# Patient Record
Sex: Female | Born: 1960 | Race: White | Hispanic: No | Marital: Married | Smoking: Never smoker
Health system: Southern US, Community
[De-identification: ages and names within clinical notes are randomized; demographics above are authoritative.]

## PROBLEM LIST (undated history)

## (undated) HISTORY — PX: BACK SURGERY: SHX140

---

## 2005-03-07 ENCOUNTER — Ambulatory Visit: Payer: Self-pay

## 2009-12-29 ENCOUNTER — Ambulatory Visit: Payer: Self-pay

## 2010-01-09 ENCOUNTER — Ambulatory Visit: Payer: Self-pay

## 2010-06-28 ENCOUNTER — Ambulatory Visit: Payer: Self-pay

## 2011-01-03 ENCOUNTER — Ambulatory Visit: Payer: Self-pay

## 2011-01-04 IMAGING — US ULTRASOUND RIGHT BREAST
1 series · 17 of 25 positions shown · non-contrast
Comparison: none

REASON FOR EXAM: DENSITY
COMMENTS:

[Series 1: ultrasound right breast · 17 of 28 slices shown]
[im 1/28]
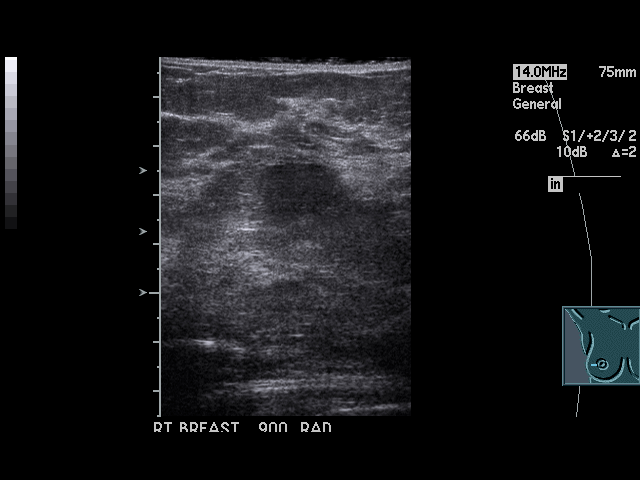
[im 3/28]
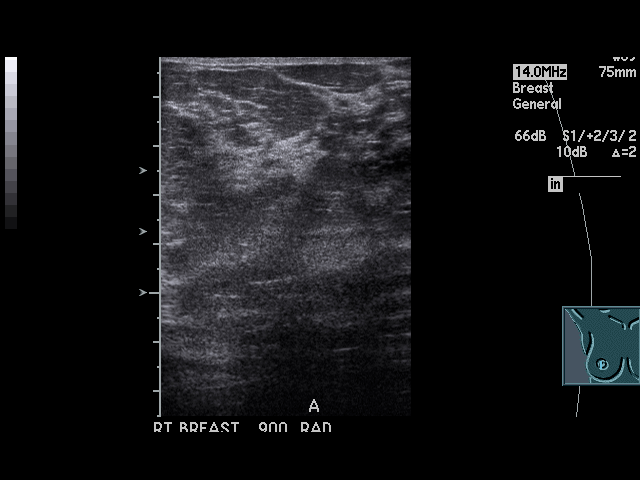
[im 4/28]
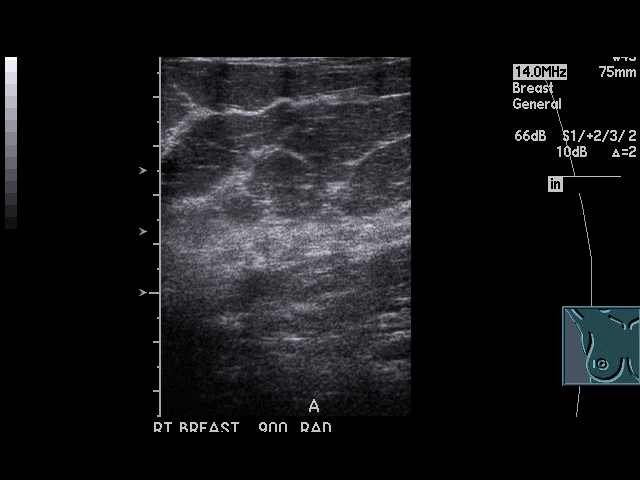
[im 6/28]
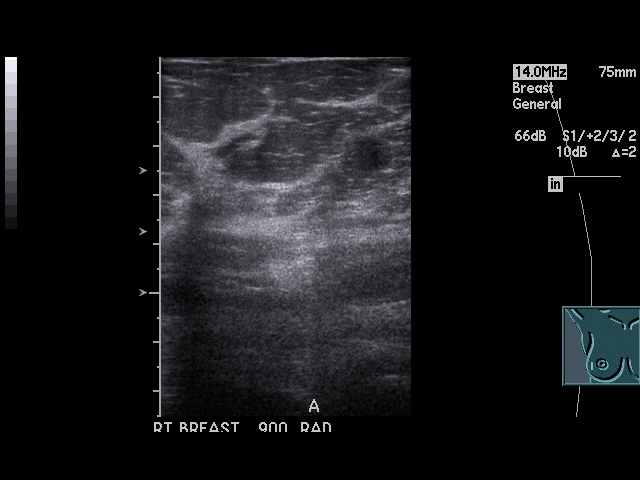
[im 7/28]
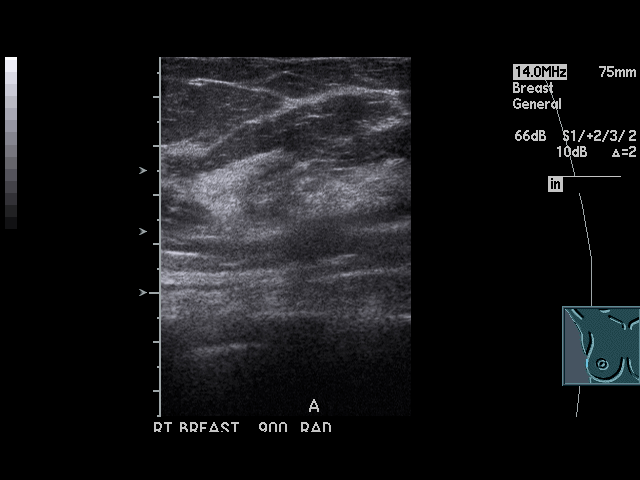
[im 10/28]
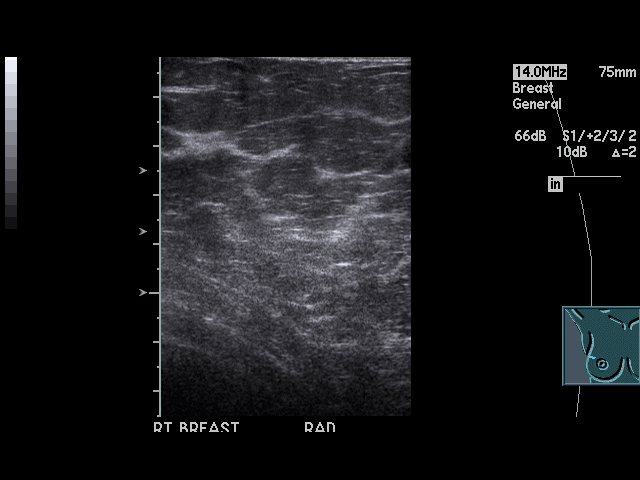
[im 11/28]
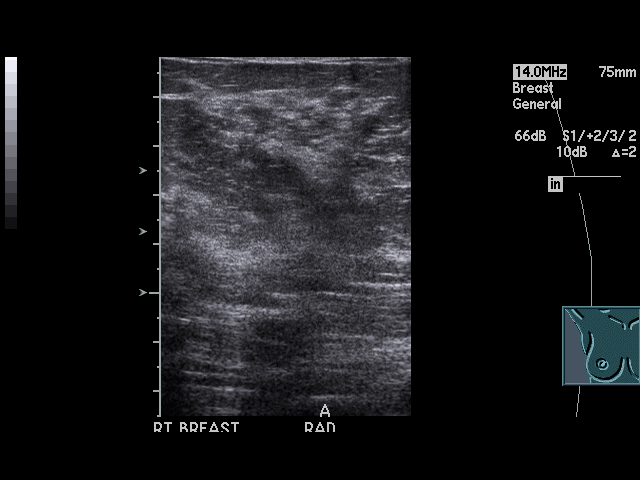
[im 13/28]
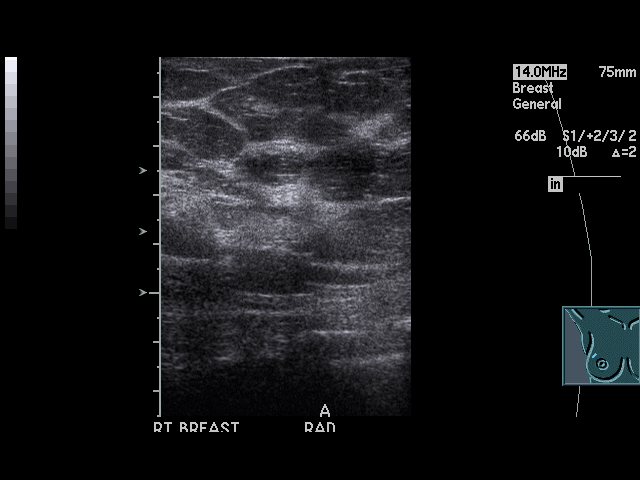
[im 14/28]
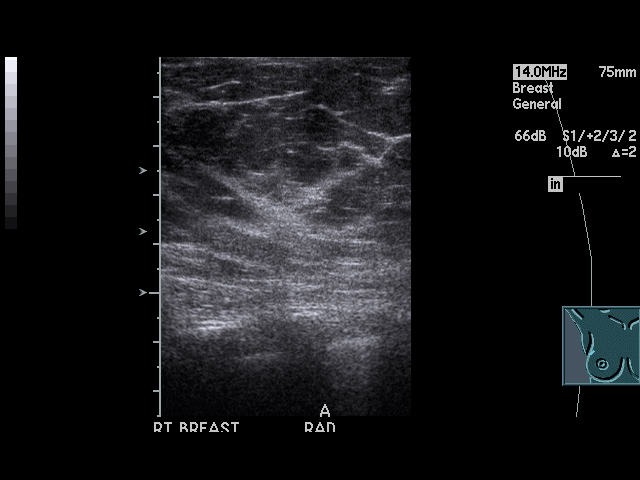
[im 15/28]
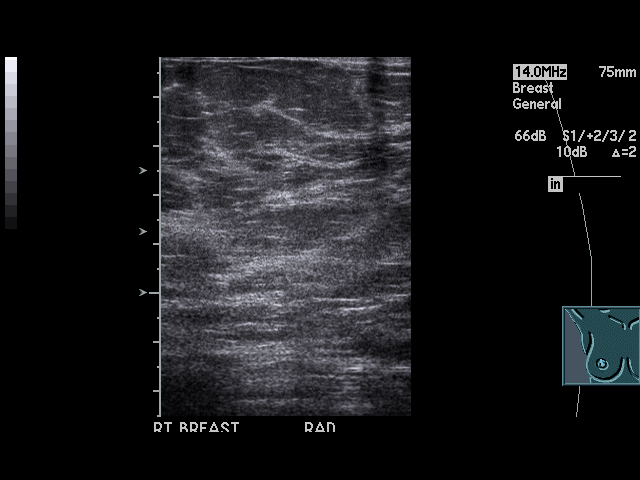
[im 17/28]
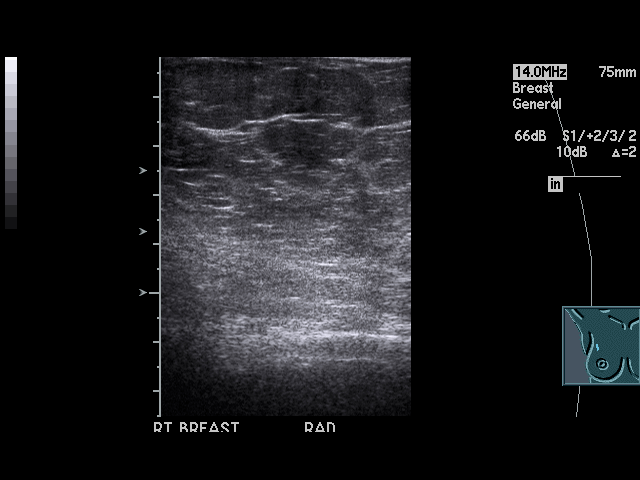
[im 19/28]
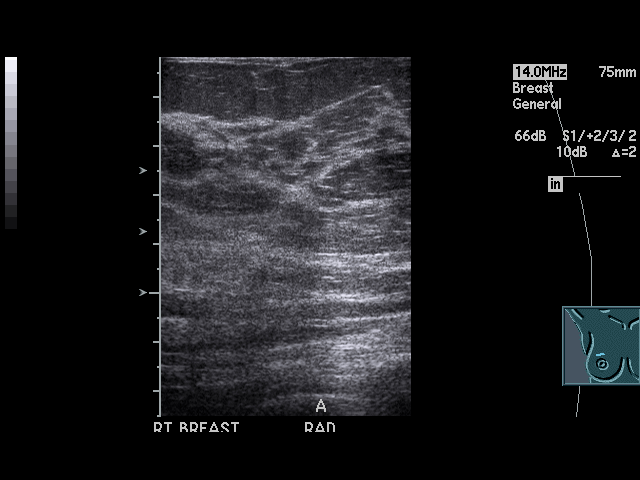
[im 21/28]
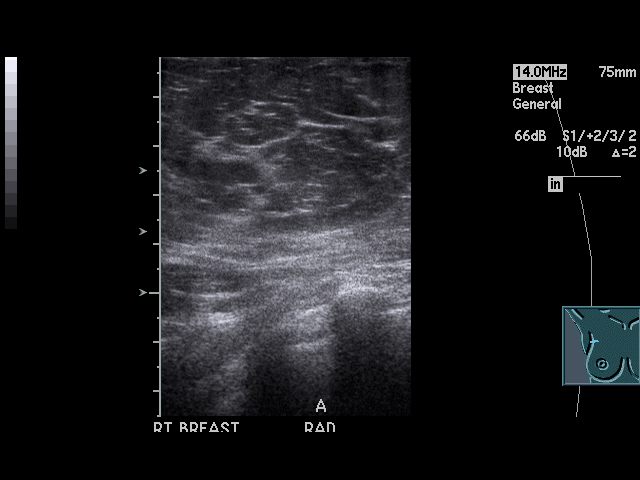
[im 22/28]
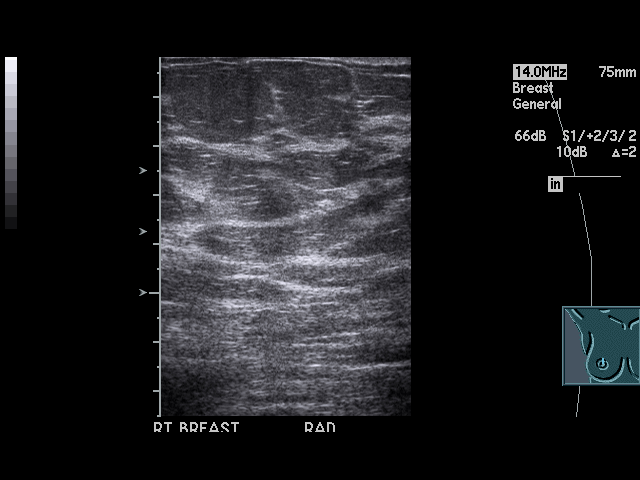
[im 24/28]
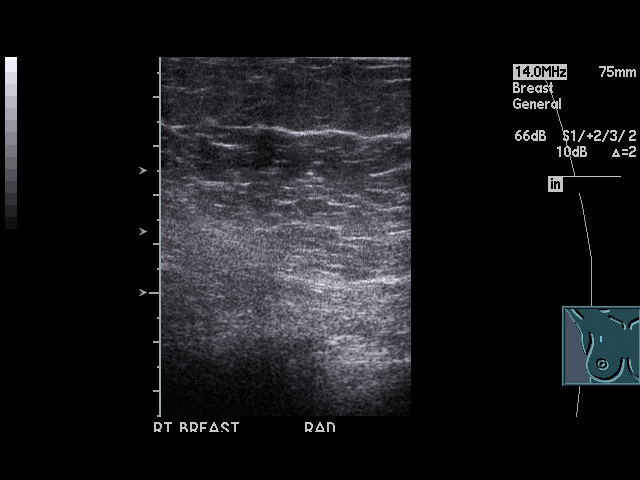
[im 25/28]
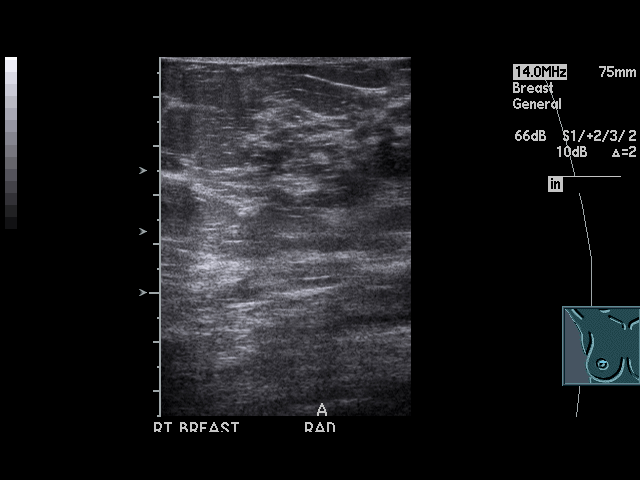
[im 28/28]
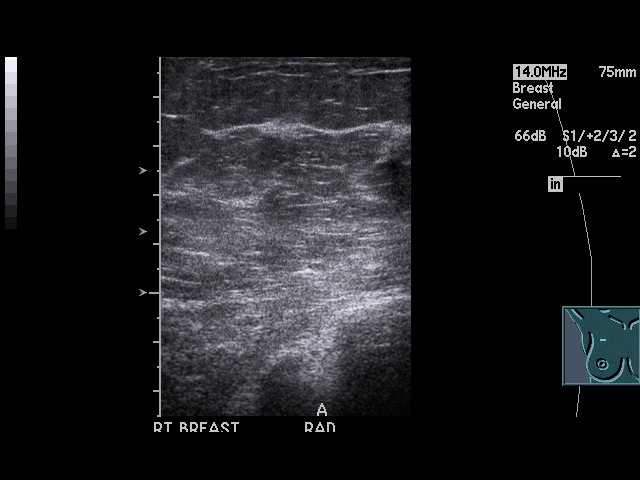

[17 of 25 positions shown; findings below may reference images not displayed]

PROCEDURE:     US  - US BREAST RIGHT  - January 09, 2010  [DATE]

RESULT:     The upper outer posterior portion of the right breast is scanned
and shows no sonographic abnormality. A focal solid or cystic mass is not
localized. Based on the mammographic appearance, this is most likely a
benign lesion such as a lymph node and is becoming increasing prominent
mammographically because of parenchymal regression. Six month follow-up is
recommended mammographically to ensure stability.
IMPRESSION: BI-RADS: Category 3 - Probably Benign Finding (interval follow-up)

## 2012-05-20 ENCOUNTER — Ambulatory Visit: Payer: Self-pay

## 2012-09-18 ENCOUNTER — Emergency Department: Payer: Self-pay | Admitting: Emergency Medicine

## 2012-09-30 ENCOUNTER — Ambulatory Visit: Payer: Self-pay | Admitting: Family Medicine

## 2013-03-30 ENCOUNTER — Ambulatory Visit: Payer: Self-pay | Admitting: Physician Assistant

## 2013-05-15 IMAGING — MG MM DIGITAL SCREENING BILAT W/ CAD
1 series · 4 of 4 positions shown · non-contrast
Comparison: none

REASON FOR EXAM: SCR MAMMO NO ORDER
COMMENTS:

PROCEDURE:     MMM - MMM DGT SCR NO ORDER W/CAD  - May 20, 2012  [DATE]
RESULT:
Comparisons: 01/03/2011 and 12/29/2009.

[R CC · right · 4 of 4 slices shown]
[im 1/4]
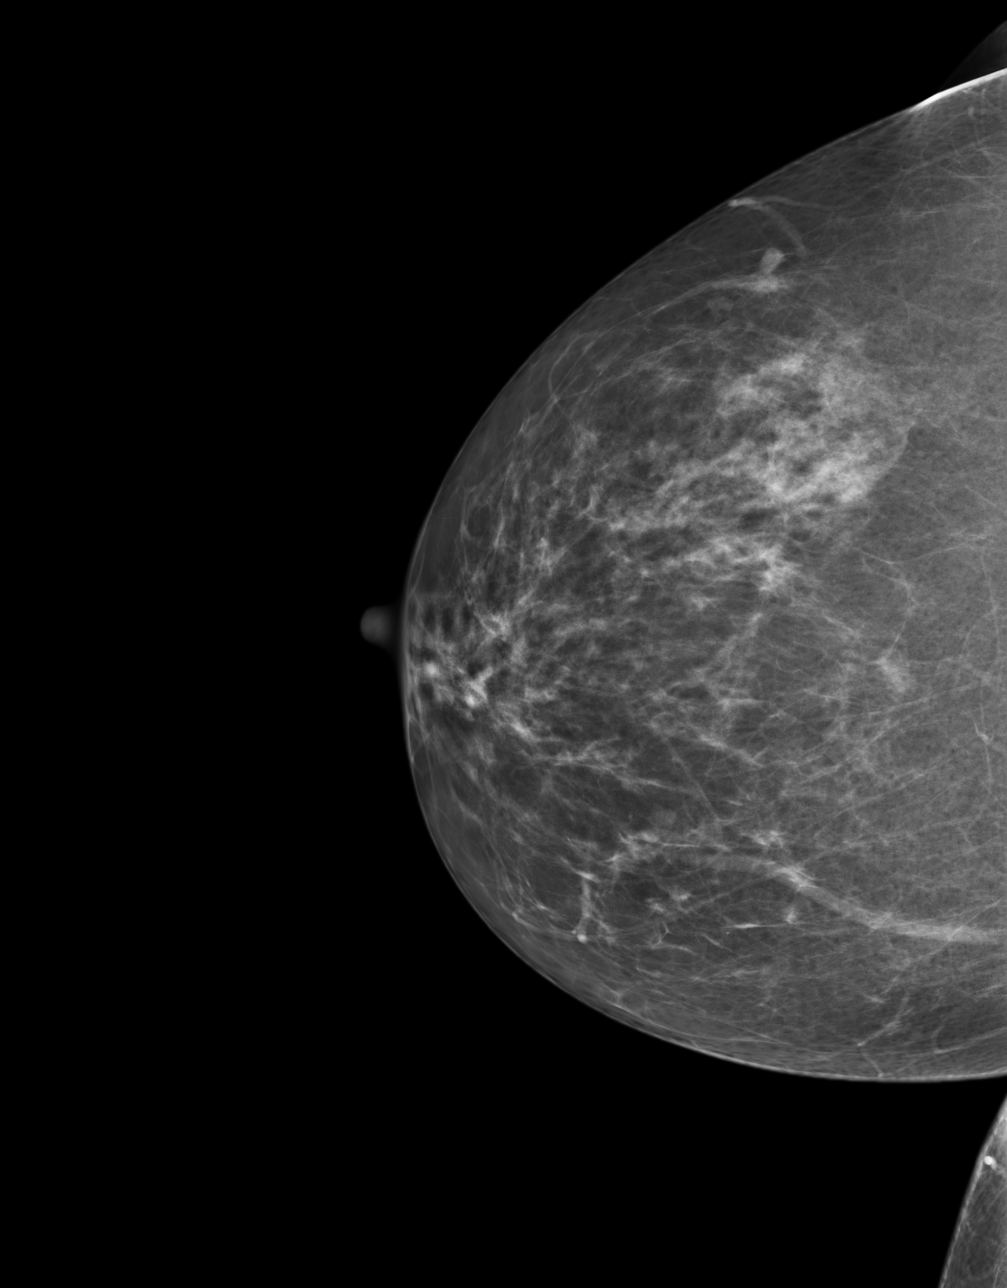
[im 2/4]
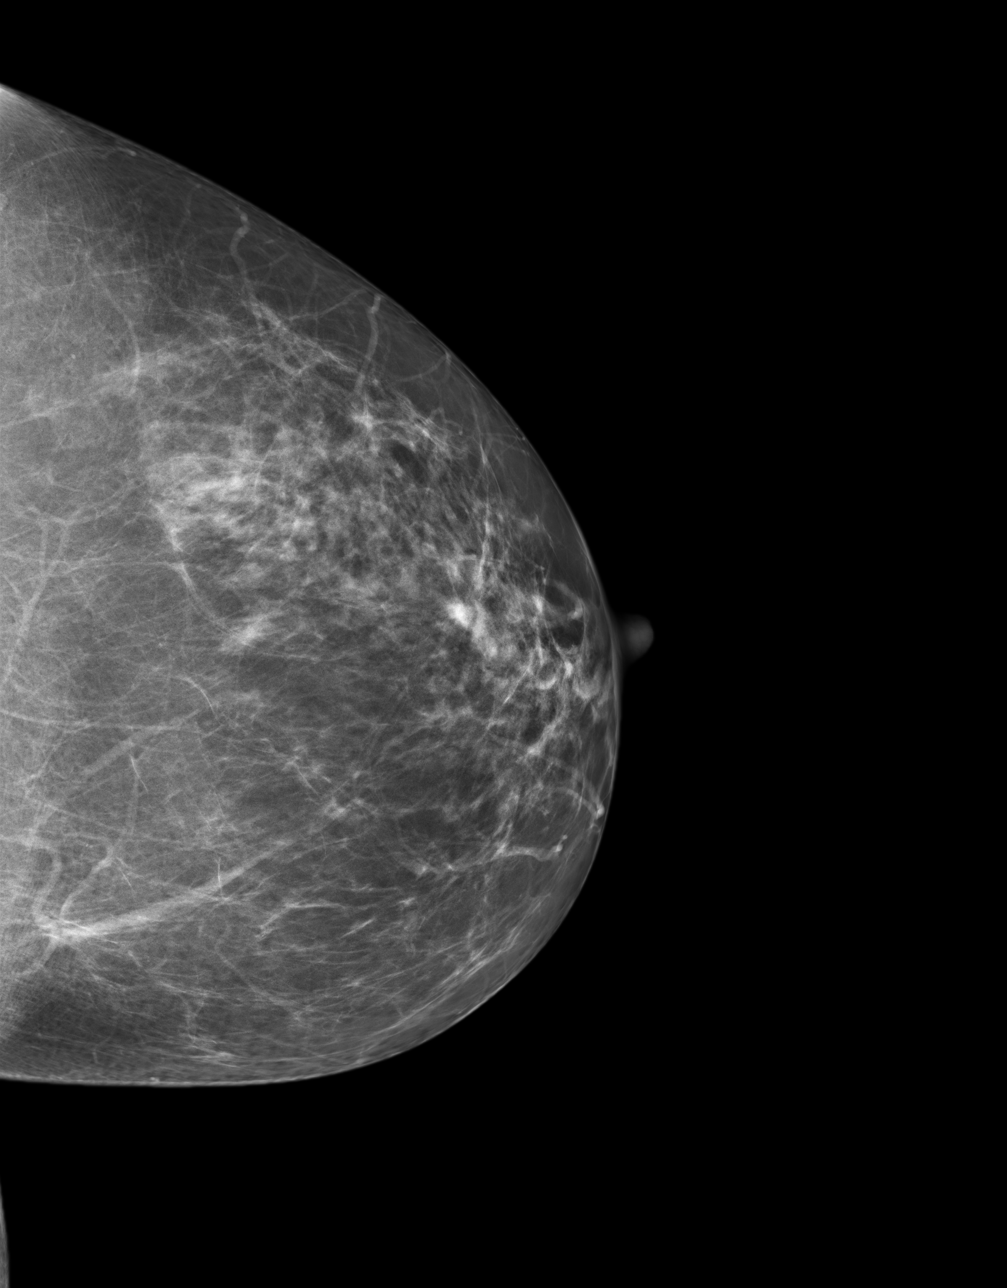
[im 3/4]
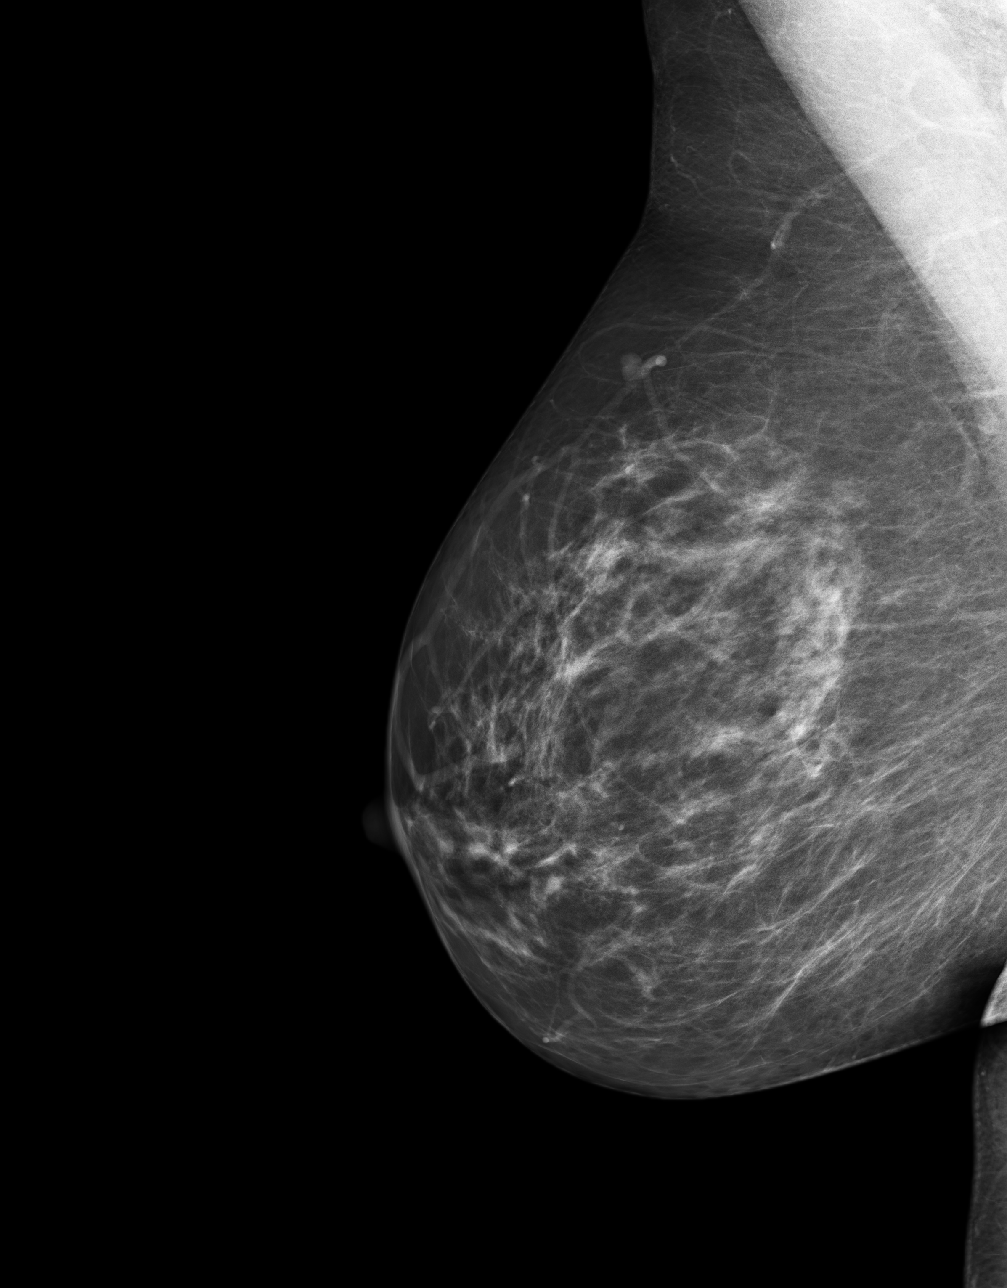
[im 4/4]
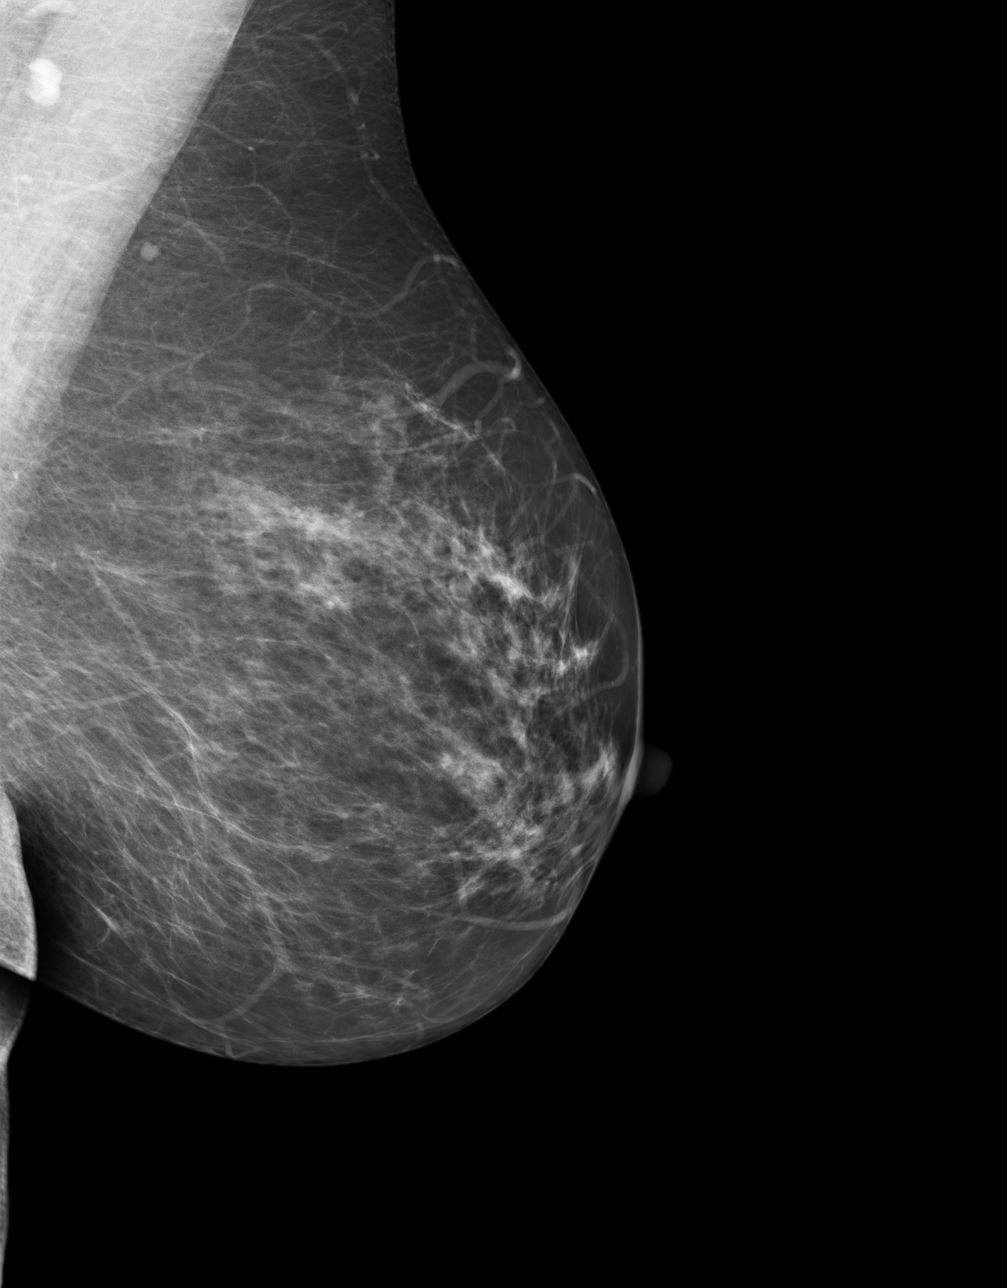

[4 of 4 positions shown; findings below may reference images not displayed]

FINDINGS: There is scattered fibroglandular tissue. No suspicious masses or
calcifications are identified. No areas of architectural distortion.
IMPRESSION: BI-RADS: Category 1 - Negative

Recommend continued annual screening mammography.

A NEGATIVE MAMMOGRAM REPORT DOES NOT PRECLUDE BIOPSY OR OTHER EVALUATION OF
A CLINICALLY PALPABLE OR OTHERWISE SUSPICIOUS MASS OR LESION. BREAST CANCER
MAY NOT BE DETECTED BY MAMMOGRAPHY IN UP TO 10% OF CASES.

## 2015-03-02 ENCOUNTER — Other Ambulatory Visit: Payer: Self-pay | Admitting: Pediatrics

## 2015-03-02 DIAGNOSIS — Z1231 Encounter for screening mammogram for malignant neoplasm of breast: Secondary | ICD-10-CM

## 2015-03-09 ENCOUNTER — Ambulatory Visit
Admission: RE | Admit: 2015-03-09 | Discharge: 2015-03-09 | Disposition: A | Discharge status: Home / Self Care | Payer: BC Managed Care – PPO | Source: Ambulatory Visit | Attending: Pediatrics | Admitting: Pediatrics

## 2015-03-09 DIAGNOSIS — Z1231 Encounter for screening mammogram for malignant neoplasm of breast: Secondary | ICD-10-CM | POA: Insufficient documentation

## 2015-07-30 ENCOUNTER — Encounter: Payer: Self-pay | Admitting: Emergency Medicine

## 2015-07-30 ENCOUNTER — Ambulatory Visit
Admission: EM | Admit: 2015-07-30 | Discharge: 2015-07-30 | Disposition: A | Discharge status: Home / Self Care | Payer: BC Managed Care – PPO | Attending: Family Medicine | Admitting: Family Medicine

## 2015-07-30 DIAGNOSIS — J020 Streptococcal pharyngitis: Secondary | ICD-10-CM

## 2015-07-30 DIAGNOSIS — J069 Acute upper respiratory infection, unspecified: Secondary | ICD-10-CM

## 2015-07-30 LAB — RAPID INFLUENZA A&B ANTIGENS
Influenza A (ARMC): NEGATIVE
Influenza B (ARMC): NEGATIVE

## 2015-07-30 LAB — RAPID STREP SCREEN (MED CTR MEBANE ONLY): Streptococcus, Group A Screen (Direct): POSITIVE — AB

## 2015-07-30 MED ORDER — PENICILLIN G BENZATHINE 1200000 UNIT/2ML IM SUSP
1.2000 10*6.[IU] | Freq: Once | INTRAMUSCULAR | Status: AC
  Administered 2015-07-30: 1.2 10*6.[IU] via INTRAMUSCULAR

## 2015-07-30 MED ORDER — FEXOFENADINE-PSEUDOEPHED ER 180-240 MG PO TB24: 1.0000 | ORAL_TABLET | Freq: Every day | ORAL | Status: DC

## 2015-07-30 MED ORDER — HYDROCOD POLST-CPM POLST ER 10-8 MG/5ML PO SUER: 5.0000 mL | Freq: Two times a day (BID) | ORAL | Status: DC | PRN

## 2015-07-30 NOTE — ED Provider Notes (Signed)
CSN: [id number]     Arrival date & time 07/30/15  1145 History   First MD Initiated Contact with Patient 07/30/15 1202    Nurses notes were reviewed. Chief Complaint  Patient presents with  . Facial Pain  . Nasal Congestion   She reports last night after washing the vascular games start having sore throat nasal congestion or cough. She has some back issues as when she starts coughing up a stress and strain on back and that made sleep last night very difficult. She finally got some Delsym which seemed to help. She did have a flu shot was course back in October. She has some chills apparently low-grade temp as well. Past smoker history she's had back surgery she never smoked and she has some back issues now which she is undergoing monitoring by orthopedic doctor for her back. From the history is only pertinent for paternal aunt who had breast cancer.    (Consider location/radiation/quality/duration/timing/severity/associated sxs/prior Treatment) Patient is a 55 y.o. female presenting with URI. The history is provided by the patient. No language interpreter was used.  URI Presenting symptoms: congestion, cough, rhinorrhea and sore throat   Severity:  Moderate Onset quality:  Sudden Duration:  12 hours Timing:  Constant Progression:  Worsening Chronicity:  New Relieved by:  Nothing Associated symptoms: myalgias, sinus pain and swollen glands   Risk factors: not elderly, no chronic cardiac disease, no chronic respiratory disease, no immunosuppression and no recent illness     History reviewed. No pertinent past medical history. Past Surgical History  Procedure Laterality Date  . Back surgery     Family History  Problem Relation Age of Onset  . Breast cancer Paternal Aunt 12   Social History  Substance Use Topics  . Smoking status: Never Smoker   . Smokeless tobacco: None  . Alcohol Use: No   OB History    No data available     Review of Systems  HENT: Positive for  congestion, rhinorrhea and sore throat.   Respiratory: Positive for cough.   Musculoskeletal: Positive for myalgias.    Allergies  Review of patient's allergies indicates no known allergies.  Home Medications   Prior to Admission medications   Medication Sig Start Date End Date Taking? Authorizing Provider  gabapentin (NEURONTIN) 600 MG tablet Take by mouth at bedtime.   Yes Historical Provider, MD  chlorpheniramine-HYDROcodone (TUSSIONEX PENNKINETIC ER) 10-8 MG/5ML SUER Take 5 mLs by mouth every 12 (twelve) hours as needed for cough. 07/30/15   Hassan Rowan, MD  fexofenadine-pseudoephedrine (ALLEGRA-D ALLERGY & CONGESTION) 180-240 MG 24 hr tablet Take 1 tablet by mouth daily. 07/30/15   Hassan Rowan, MD   Meds Ordered and Administered this Visit   Medications  penicillin g benzathine (BICILLIN LA) 1200000 UNIT/2ML injection 1.2 Million Units (not administered)    BP 124/75 mmHg  Pulse 82  Temp(Src) 100.6 F (38.1 C) (Tympanic)  Resp 16  Ht  (1.676 m)  Wt 196 lb (88.905 kg)  BMI 31.65 kg/m2  SpO2 100% No data found.   Physical Exam  Constitutional: She appears well-developed and well-nourished.  HENT:  Head: Normocephalic and atraumatic.  Right Ear: Hearing, tympanic membrane, external ear and ear canal normal.  Left Ear: Hearing, tympanic membrane, external ear and ear canal normal.  Nose: Mucosal edema present. No rhinorrhea. Right sinus exhibits no maxillary sinus tenderness and no frontal sinus tenderness. Left sinus exhibits no maxillary sinus tenderness and no frontal sinus tenderness.  Mouth/Throat: Uvula is midline.  Posterior oropharyngeal erythema present.  Eyes: Conjunctivae are normal. Pupils are equal, round, and reactive to light.  Neck: Normal range of motion. Neck supple.  Cardiovascular: Normal rate, regular rhythm and normal heart sounds.   Pulmonary/Chest: Effort normal and breath sounds normal. No respiratory distress. She has no wheezes.   Musculoskeletal: Normal range of motion.  Lymphadenopathy:    She has cervical adenopathy.  Neurological: She is alert.  Skin: Skin is warm and dry. No erythema.  Psychiatric: She has a normal mood and affect.  Vitals reviewed.   ED Course  Procedures (including critical care time)  Labs Review Labs Reviewed  RAPID STREP SCREEN (NOT AT [facility]) - Abnormal; Notable for the following:    Streptococcus, Group A Screen (Direct) POSITIVE (*)    All other components within normal limits  RAPID INFLUENZA A&B ANTIGENS ([facility] ONLY)    Imaging Review No results found.   Visual Acuity Review  Right Eye Distance:   Left Eye Distance:   Bilateral Distance:    Right Eye Near:   Left Eye Near:    Bilateral Near:      Results for orders placed or performed during the hospital encounter of 07/30/15  Rapid strep screen  Result Value Ref Range   Streptococcus, Group A Screen (Direct) POSITIVE (A) NEGATIVE  Rapid Influenza A&B Antigens ([facility] only)  Result Value Ref Range   Influenza A ([facility]) NEGATIVE NEGATIVE   Influenza B ([facility]) NEGATIVE NEGATIVE     MDM   1. Strep pharyngitis   2. URI, acute    Strep test was positive flu test were negative which was a surprise for symptoms but she states she's get strep a lot and she gets it she gets real sick. Offered she accepted LA Bicillin 1.2 million units IM will place Allegra-D and Tussionex for the symptoms she is having but she should be ready to go back to school and work on Monday. See her PCP if not improving or better.  Note: This dictation was prepared with Dragon dictation along with smaller phrase technology. Any transcriptional errors that result from this process are unintentional.  Hassan RowanEugene Dyson Sevey, MD 07/30/15 1256

## 2015-07-30 NOTE — ED Notes (Signed)
Patient was treated for sinus infection 3 weeks ago.  Patient reports sinus congestion, nasal congestion, cough, and chest congestion started last night.  Patient reports chills.

## 2015-07-30 NOTE — Discharge Instructions (Signed)
Strep Throat Strep throat is an infection of the throat. It is caused by germs. Strep throat spreads from person to person because of coughing, sneezing, or close contact. HOME CARE Medicines  Take over-the-counter and prescription medicines only as told by your doctor.  Take your antibiotic medicine as told by your doctor. Do not stop taking the medicine even if you feel better.  Have family members who also have a sore throat or fever go to a doctor. Eating and Drinking  Do not share food, drinking cups, or personal items.  Try eating soft foods until your sore throat feels better.  Drink enough fluid to keep your pee (urine) clear or pale yellow. General Instructions  Rinse your mouth (gargle) with a salt-water mixture 3-4 times per day or as needed. To make a salt-water mixture, stir -1 tsp of salt into 1 cup of warm water.  Make sure that all people in your house wash their hands well.  Rest.  Stay home from school or work until you have been taking antibiotics for 24 hours.  Keep all follow-up visits as told by your doctor. This is important. GET HELP IF:  Your neck keeps getting bigger.  You get a rash, cough, or earache.  You cough up thick liquid that is green, yellow-brown, or bloody.  You have pain that does not get better with medicine.  Your problems get worse instead of getting better.  You have a fever. GET HELP RIGHT AWAY IF:  You throw up (vomit).  You get a very bad headache.  You neck hurts or it feels stiff.  You have chest pain or you are short of breath.  You have drooling, very bad throat pain, or changes in your voice.  Your neck is swollen or the skin gets red and tender.  Your mouth is dry or you are peeing less than normal.  You keep feeling more tired or it is hard to wake up.  Your joints are red or they hurt.   This information is not intended to replace advice given to you by your health care provider. Make sure you  discuss any questions you have with your health care provider.   Document Released: 10/24/2007 Document Revised: 01/26/2015 Document Reviewed: 08/30/2014 Elsevier Interactive Patient Education 2016 Elsevier Inc.  Upper Respiratory Infection, Adult Most upper respiratory infections (URIs) are caused by a virus. A URI affects the nose, throat, and upper air passages. The most common type of URI is often called "the common cold." HOME CARE   Take medicines only as told by your doctor.  Gargle warm saltwater or take cough drops to comfort your throat as told by your doctor.  Use a warm mist humidifier or inhale steam from a shower to increase air moisture. This may make it easier to breathe.  Drink enough fluid to keep your pee (urine) clear or pale yellow.  Eat soups and other clear broths.  Have a healthy diet.  Rest as needed.  Go back to work when your fever is gone or your doctor says it is okay.  You may need to stay home longer to avoid giving your URI to others.  You can also wear a face mask and wash your hands often to prevent spread of the virus.  Use your inhaler more if you have asthma.  Do not use any tobacco products, including cigarettes, chewing tobacco, or electronic cigarettes. If you need help quitting, ask your doctor. GET HELP IF:  You are  getting worse, not better.  Your symptoms are not helped by medicine.  You have chills.  You are getting more short of breath.  You have brown or red mucus.  You have yellow or brown discharge from your nose.  You have pain in your face, especially when you bend forward.  You have a fever.  You have puffy (swollen) neck glands.  You have pain while swallowing.  You have white areas in the back of your throat. GET HELP RIGHT AWAY IF:   You have very bad or constant:  Headache.  Ear pain.  Pain in your forehead, behind your eyes, and over your cheekbones (sinus pain).  Chest pain.  You have  long-lasting (chronic) lung disease and any of the following:  Wheezing.  Long-lasting cough.  Coughing up blood.  A change in your usual mucus.  You have a stiff neck.  You have changes in your:  Vision.  Hearing.  Thinking.  Mood. MAKE SURE YOU:   Understand these instructions.  Will watch your condition.  Will get help right away if you are not doing well or get worse.   This information is not intended to replace advice given to you by your health care provider. Make sure you discuss any questions you have with your health care provider.   Document Released: 10/24/2007 Document Revised: 09/21/2014 Document Reviewed: 08/12/2013 Elsevier Interactive Patient Education 2016 Elsevier Inc. Influenza Tests WHY AM I HAVING THIS TEST? You may have an influenza test to help your health care provider determine what type of respiratory infection you have. The test may also be used to help determine a treatment plan and to monitor influenza activity within a community. There are two types of influenza virus: types A and B. Often, one strain of type A influenza will be the most common type of influenza in a community during flu season. This is typically between the months of October and May. Influenza tests can help determine which strain of influenza type A is occurring most often in the community. WHAT KIND OF SAMPLE IS TAKEN? Influenza tests are performed by collecting a small sample of fluids (secretions) from your nose or throat using a cotton swab. Tests performed on nasal secretions are more accurate than tests performed on a sample taken from your throat.  Rapid influenza tests are available and have become the most frequently used tests for influenza. They are most accurate when completed within the first 48 hours after your symptoms begin.  Depending on the method, a rapid influenza test may be completed in your health care provider's office in less than 30 minutes. It can  also be sent to a lab with the results available the same day.  Depending on the particular type of test used, it can identify influenza type A, a mixture of types A and B, or differentiate between type A and B.  Another test that your health care provider may order is a viral culture. This also requires the collection of secretions from your nose or throat. The sample is then sent to a lab for processing. This may take several days to complete. HOW ARE YOUR TEST RESULTS REPORTED? Your test results will be reported as either positive or negative. A false-negative result can occur. A false-negative result is incorrect because it indicates a condition or finding is not present when it is. It is your responsibility to obtain your test results. Ask the lab or department performing the test when and how you will  get your results. WHAT DO THE RESULTS MEAN?  A positive test means you have influenza. Tests may further determine the type of influenza you have.  A negative influenza test result means it is not likely that you have influenza.  A false-negative result can occur. False-negative results are more likely to happen at the height of the influenza season. Talk with your health care provider to discuss your results, treatment options, and if necessary, the need for more tests. Talk with your health care provider if you have any questions about your results.   This information is not intended to replace advice given to you by your health care provider. Make sure you discuss any questions you have with your health care provider.   Document Released: 02/14/2005 Document Revised: 05/28/2014 Document Reviewed: 09/23/2013 Elsevier Interactive Patient Education [employer] Elsevier Inc.

## 2016-04-24 ENCOUNTER — Other Ambulatory Visit: Payer: Self-pay | Admitting: Pediatrics

## 2016-04-30 ENCOUNTER — Other Ambulatory Visit: Payer: Self-pay | Admitting: Pediatrics

## 2016-04-30 DIAGNOSIS — Z1231 Encounter for screening mammogram for malignant neoplasm of breast: Secondary | ICD-10-CM

## 2016-05-22 ENCOUNTER — Ambulatory Visit
Admission: RE | Admit: 2016-05-22 | Discharge: 2016-05-22 | Disposition: A | Discharge status: Home / Self Care | Payer: BC Managed Care – PPO | Source: Ambulatory Visit | Attending: Pediatrics | Admitting: Pediatrics

## 2016-05-22 DIAGNOSIS — Z1231 Encounter for screening mammogram for malignant neoplasm of breast: Secondary | ICD-10-CM | POA: Diagnosis present

## 2018-04-18 ENCOUNTER — Other Ambulatory Visit: Payer: Self-pay

## 2018-04-18 ENCOUNTER — Encounter: Payer: Self-pay | Admitting: Emergency Medicine

## 2018-04-18 ENCOUNTER — Ambulatory Visit
Admission: EM | Admit: 2018-04-18 | Discharge: 2018-04-18 | Disposition: A | Discharge status: Home / Self Care | Payer: BC Managed Care – PPO | Attending: Family Medicine | Admitting: Family Medicine

## 2018-04-18 DIAGNOSIS — N3000 Acute cystitis without hematuria: Secondary | ICD-10-CM

## 2018-04-18 LAB — URINALYSIS, COMPLETE (UACMP) WITH MICROSCOPIC
Bilirubin Urine: NEGATIVE
GLUCOSE, UA: NEGATIVE mg/dL
Hgb urine dipstick: NEGATIVE
Nitrite: NEGATIVE
PH: 5.5 (ref 5.0–8.0)
Specific Gravity, Urine: 1.025 (ref 1.005–1.030)
Squamous Epithelial / LPF: NONE SEEN (ref 0–5)
WBC, UA: >50 WBC/hpf (ref 0–5)

## 2018-04-18 MED ORDER — NITROFURANTOIN MONOHYD MACRO 100 MG PO CAPS: 100.0000 mg | ORAL_CAPSULE | Freq: Two times a day (BID) | ORAL | 0 refills | Status: DC

## 2018-04-18 NOTE — ED Provider Notes (Signed)
[facility]    CSN: [id number] Arrival date & time: 04/18/18  1624  History   Chief Complaint Chief Complaint  Patient presents with  . Dysuria  . Urinary Frequency   HPI  57 year old female presents with complaints.  1 week history of dysuria and urinary frequency.  She states that dysuria is mild.  No fever.  No back pain.  No abdominal pain.  No medications or interventions tried.  No known exacerbating factors.  No other complaints or concerns.  Social Hx reviewed as below. Social History Social History   Tobacco Use  . Smoking status: Never Smoker  . Smokeless tobacco: Never Used  Substance Use Topics  . Alcohol use: No  . Drug use: Not on file     Allergies   Patient has no known allergies.   Review of Systems Review of Systems  Constitutional: Negative for fever.  Gastrointestinal: Negative.   Genitourinary: Positive for dysuria and frequency.   Physical Exam Triage Vital Signs ED Triage Vitals  Enc Vitals Group     BP 04/18/18 1641 136/83     Pulse Rate 04/18/18 1641 62     Resp 04/18/18 1641 16     Temp 04/18/18 1641 98 F (36.7 C)     Temp Source 04/18/18 1641 Oral     SpO2 04/18/18 1641 98 %     Weight 04/18/18 1638 176 lb (79.8 kg)     Height 04/18/18 1638 5' 6.5" (1.689 m)     Head Circumference --      Peak Flow --      Pain Score 04/18/18 1638 6     Pain Loc --      Pain Edu? --      Excl. in GC? --    Updated Vital Signs BP 136/83 (BP Location: Left Arm)   Pulse 62   Temp 98 F (36.7 C) (Oral)   Resp 16   Ht 5' 6.5" (1.689 m)   Wt 79.8 kg   SpO2 98%   BMI 27.98 kg/m   Visual Acuity Right Eye Distance:   Left Eye Distance:   Bilateral Distance:    Right Eye Near:   Left Eye Near:    Bilateral Near:     Physical Exam  Constitutional: She is oriented to person, place, and time. She appears well-developed. No distress.  Cardiovascular: Normal rate and regular rhythm.  Pulmonary/Chest: Effort normal and  breath sounds normal.  Abdominal: Soft. She exhibits no distension. There is no tenderness.  Neurological: She is alert and oriented to person, place, and time.  Psychiatric: She has a normal mood and affect. Her behavior is normal.  Nursing note and vitals reviewed.  UC Treatments / Results  Labs (all labs ordered are listed, but only abnormal results are displayed) Labs Reviewed  URINALYSIS, COMPLETE (UACMP) WITH MICROSCOPIC - Abnormal; Notable for the following components:      Result Value   APPearance HAZY (*)    Ketones, ur TRACE (*)    Protein, ur TRACE (*)    Leukocytes, UA LARGE (*)    Bacteria, UA RARE (*)    All other components within normal limits  URINE CULTURE    EKG None  Radiology No results found.  Procedures Procedures (including critical care time)  Medications Ordered in UC Medications - No data to display  Initial Impression / Assessment and Plan / UC Course  I have reviewed the triage vital signs and the nursing notes.  Pertinent labs & imaging results that were available during my care of the patient were reviewed by me and considered in my medical decision making (see chart for details).    57 year old female presents with UTI.  Placing on Macrobid.  Sending culture.  Final Clinical Impressions(s) / UC Diagnoses   Final diagnoses:  Acute cystitis without hematuria   Discharge Instructions   None    ED Prescriptions    Medication Sig Dispense Auth. Provider   nitrofurantoin, macrocrystal-monohydrate, (MACROBID) 100 MG capsule Take 1 capsule (100 mg total) by mouth 2 (two) times daily. 10 capsule Tommie Sams, DO     Controlled Substance Prescriptions [place] Controlled Substance Registry consulted? Not Applicable   Tommie Sams, DO 04/18/18 1746

## 2018-04-18 NOTE — ED Triage Notes (Signed)
Patient c/o some burning when urinating and urinary pressure and urgency for a week.

## 2018-04-21 LAB — URINE CULTURE: Culture: 50000 — AB

## 2018-04-22 ENCOUNTER — Telehealth (HOSPITAL_COMMUNITY): Payer: Self-pay | Admitting: Emergency Medicine

## 2018-04-22 MED ORDER — SULFAMETHOXAZOLE-TRIMETHOPRIM 800-160 MG PO TABS: 1.0000 | ORAL_TABLET | Freq: Two times a day (BID) | ORAL | 0 refills | Status: AC

## 2018-04-22 NOTE — Telephone Encounter (Signed)
Urine culture was positive for Klebsiella pneumoniae resistant to macrobid given at urgent care visit. Prescription for bactrim  per sent to pharmacy of choice. Pt called and made aware. Pt educated to follow up if symptoms are not improving. Verbalized understanding.

## 2021-03-31 ENCOUNTER — Encounter: Payer: Self-pay | Admitting: Licensed Clinical Social Worker

## 2021-03-31 ENCOUNTER — Ambulatory Visit
Admission: EM | Admit: 2021-03-31 | Discharge: 2021-03-31 | Disposition: A | Discharge status: Home / Self Care | Payer: BC Managed Care – PPO | Attending: Physician Assistant | Admitting: Physician Assistant

## 2021-03-31 ENCOUNTER — Other Ambulatory Visit: Payer: Self-pay

## 2021-03-31 DIAGNOSIS — L03011 Cellulitis of right finger: Secondary | ICD-10-CM | POA: Diagnosis not present

## 2021-03-31 MED ORDER — CEPHALEXIN 500 MG PO CAPS: 500.0000 mg | ORAL_CAPSULE | Freq: Four times a day (QID) | ORAL | 0 refills | Status: AC

## 2021-03-31 NOTE — Discharge Instructions (Signed)
-  I have sent oral antibiotics to pharmacy.  As we discussed if the finger is looking any worse or not better in the next few days or you develop a fever, red streak up the finger or increased pain you should be seen again as you may need to perform an incision and drainage.

## 2021-03-31 NOTE — ED Triage Notes (Signed)
Pt c/o of finger pain and infection. Pt is using mupirocin ointment that was given 3 days ago.

## 2021-03-31 NOTE — ED Provider Notes (Signed)
[facility]    CSN: [id number] Arrival date & time: 03/31/21  1529      History   Chief Complaint Chief Complaint  Patient presents with   Finger Injury    HPI Linda Rivers is a 60 y.o. female presenting for 2 to 3-day history of pain of the cuticle of the right middle finger.  Patient says she has had these infections multiple times in the past.  She says she has received an oral antibiotic and mupirocin every time.  Patient says she has been using leftover mupirocin over the past couple of days and has been soaking her finger but it has not helped.  She denies any associated fevers, red streaks or swelling.  She has pain which is worse at night.  Reports throbbing discomfort.  Has no other complaints.  HPI  History reviewed. No pertinent past medical history.  There are no problems to display for this patient.   Past Surgical History:  Procedure Laterality Date   BACK SURGERY      OB History   No obstetric history on file.      Home Medications    Prior to Admission medications   Medication Sig Start Date End Date Taking? Authorizing Provider  cephALEXin (KEFLEX) 500 MG capsule Take 1 capsule (500 mg total) by mouth 4 (four) times daily for 5 days. 03/31/21 04/05/21 Yes Shirlee Latch, PA-C    Family History Family History  Problem Relation Age of Onset   Breast cancer Paternal Aunt 24    Social History Social History   Tobacco Use   Smoking status: Never   Smokeless tobacco: Never  Vaping Use   Vaping Use: Never used  Substance Use Topics   Alcohol use: No     Allergies   Patient has no known allergies.   Review of Systems Review of Systems  Constitutional:  Negative for fatigue and fever.  Musculoskeletal:  Negative for arthralgias and joint swelling.  Skin:  Positive for color change. Negative for wound.  Neurological:  Negative for weakness.  Hematological:  Negative for adenopathy.    Physical Exam Triage Vital  Signs ED Triage Vitals  Enc Vitals Group     BP 03/31/21 1750 (!) 132/99     Pulse Rate 03/31/21 1750 76     Resp 03/31/21 1750 16     Temp 03/31/21 1750 98.3 F (36.8 C)     Temp src --      SpO2 03/31/21 1750 96 %     Weight 03/31/21 1748 190 lb (86.2 kg)     Height 03/31/21 1748 5\' 6"  (1.676 m)     Head Circumference --      Peak Flow --      Pain Score 03/31/21 1748 10     Pain Loc --      Pain Edu? --      Excl. in GC? --    No data found.  Updated Vital Signs BP (!) 132/99 (BP Location: Left Arm)   Pulse 76   Temp 98.3 F (36.8 C)   Resp 16   Ht 5\' 6"  (1.676 m)   Wt 190 lb (86.2 kg)   SpO2 96%   BMI 30.67 kg/m    Physical Exam Vitals and nursing note reviewed.  Constitutional:      General: She is not in acute distress.    Appearance: Normal appearance. She is not ill-appearing or toxic-appearing.  HENT:     Head:  Normocephalic and atraumatic.  Eyes:     General: No scleral icterus.       Right eye: No discharge.        Left eye: No discharge.     Conjunctiva/sclera: Conjunctivae normal.  Cardiovascular:     Rate and Rhythm: Normal rate and regular rhythm.     Pulses: Normal pulses.  Pulmonary:     Effort: Pulmonary effort is normal. No respiratory distress.  Musculoskeletal:     Cervical back: Neck supple.  Skin:    General: Skin is dry.     Comments: Yellowish discoloration under skin/cuticle of right middle finger. Area is TTP. No erythema or swelling  Neurological:     General: No focal deficit present.     Mental Status: She is alert. Mental status is at baseline.     Motor: No weakness.     Gait: Gait normal.  Psychiatric:        Mood and Affect: Mood normal.        Behavior: Behavior normal.        Thought Content: Thought content normal.     UC Treatments / Results  Labs (all labs ordered are listed, but only abnormal results are displayed) Labs Reviewed - No data to display  EKG   Radiology No results  found.  Procedures Procedures (including critical care time)  Medications Ordered in UC Medications - No data to display  Initial Impression / Assessment and Plan / UC Course  I have reviewed the triage vital signs and the nursing notes.  Pertinent labs & imaging results that were available during my care of the patient were reviewed by me and considered in my medical decision making (see chart for details).  60 year old female presenting for paronychia of the finger of her right hand for the past 2 to 3 days.  I did discuss with patient that generally we perform an incision and drainage on these.  She declines to have that done today as she says an oral antibiotic has worked for her every time in the past and she would just like to take the antibiotic.  Advised patient we can try an antibiotic.  Will cook she has been prescribed Keflex on multiple occasions.  I have sent Keflex to pharmacy.  Advised to continue with the mupirocin and warm water soaks.  Thoroughly reviewed return and ED precautions with patient.   Final Clinical Impressions(s) / UC Diagnoses   Final diagnoses:  Paronychia of finger of right hand     Discharge Instructions      -I have sent oral antibiotics to pharmacy.  As we discussed if the finger is looking any worse or not better in the next few days or you develop a fever, red streak up the finger or increased pain you should be seen again as you may need to perform an incision and drainage.     ED Prescriptions     Medication Sig Dispense Auth. Provider   cephALEXin (KEFLEX) 500 MG capsule Take 1 capsule (500 mg total) by mouth 4 (four) times daily for 5 days. 20 capsule Shirlee Latch, PA-C      PDMP not reviewed this encounter.   Shirlee Latch, PA-C 03/31/21 [phone]

## 2022-04-20 ENCOUNTER — Ambulatory Visit
Admission: RE | Admit: 2022-04-20 | Discharge: 2022-04-20 | Disposition: A | Discharge status: Home / Self Care | Payer: BC Managed Care – PPO | Source: Ambulatory Visit | Attending: Urgent Care | Admitting: Urgent Care

## 2022-04-20 VITALS — BP 150/89 | HR 56 | Temp 98.7°F | Resp 16 | Ht 66.0 in | Wt 183.0 lb

## 2022-04-20 DIAGNOSIS — J45909 Unspecified asthma, uncomplicated: Secondary | ICD-10-CM

## 2022-04-20 DIAGNOSIS — J069 Acute upper respiratory infection, unspecified: Secondary | ICD-10-CM

## 2022-04-20 MED ORDER — PREDNISONE 20 MG PO TABS: ORAL_TABLET | ORAL | 0 refills | Status: AC

## 2022-04-20 NOTE — ED Triage Notes (Signed)
Chief Complaint: productive cough and congestion, some body aches. States she had some sore throat as well  Onset: Sunday  OTC medications tried: Yes- Mucinex AM &PM; Robitussin    with mild relief  Sick exposure: Yes- her daughter   New foods or medications: No  Recent Travel: No

## 2022-04-20 NOTE — ED Provider Notes (Signed)
[facility]    CSN: [id number] Arrival date & time: 04/20/22  1501      History   Chief Complaint Chief Complaint  Patient presents with   Cough    Appointment    HPI Linda Rivers is a 61 y.o. female.    Cough   Presents to urgent care with complaint of productive cough and chest congestion.  Also endorses body aches.  Reports sore throat.  Symptoms since Sunday (5 days).  She endorses exposure to her daughter who was sick.  No known diagnosis.  She reports feeling tightness and discomfort in her chest.  Describes as a feeling of burning.  She also reports increasing sinus pressure and discomfort.  She endorses a history of frequent sinus problems because of allergies but states the symptoms are much worse.  She denies history of asthma or COPD.  She is a former smoker who quit over 20 years ago.  She reports adequate treatment of her cough with Mucinex combo med in the a.m. and Robitussin combo med in the p.m.  History reviewed. No pertinent past medical history.  There are no problems to display for this patient.   Past Surgical History:  Procedure Laterality Date   BACK SURGERY      OB History   No obstetric history on file.      Home Medications    Prior to Admission medications   Not on File    Family History Family History  Problem Relation Age of Onset   Breast cancer Paternal Aunt 22    Social History Social History   Tobacco Use   Smoking status: Never   Smokeless tobacco: Never  Vaping Use   Vaping Use: Never used  Substance Use Topics   Alcohol use: No     Allergies   Patient has no known allergies.   Review of Systems Review of Systems  Respiratory:  Positive for cough.      Physical Exam Triage Vital Signs ED Triage Vitals  Enc Vitals Group     BP 04/20/22 1521 (!) 150/89     Pulse Rate 04/20/22 1521 (!) 56     Resp 04/20/22 1521 16     Temp 04/20/22 1521 98.7 F (37.1 C)     Temp Source  04/20/22 1521 Oral     SpO2 04/20/22 1521 96 %     Weight 04/20/22 1523 183 lb (83 kg)     Height 04/20/22 1523 5\' 6"  (1.676 m)     Head Circumference --      Peak Flow --      Pain Score 04/20/22 1521 0     Pain Loc --      Pain Edu? --      Excl. in GC? --    No data found.  Updated Vital Signs BP (!) 150/89 (BP Location: Left Wrist)   Pulse (!) 56   Temp 98.7 F (37.1 C) (Oral)   Resp 16   Ht 5\' 6"  (1.676 m)   Wt 183 lb (83 kg)   SpO2 96%   BMI 29.54 kg/m   Visual Acuity Right Eye Distance:   Left Eye Distance:   Bilateral Distance:    Right Eye Near:   Left Eye Near:    Bilateral Near:     Physical Exam Vitals reviewed.  Constitutional:      Appearance: She is ill-appearing.  HENT:     Nose: Congestion present.     Right  Sinus: Maxillary sinus tenderness present.     Left Sinus: Maxillary sinus tenderness present.  Cardiovascular:     Rate and Rhythm: Normal rate and regular rhythm.     Pulses: Normal pulses.     Heart sounds: Normal heart sounds.  Pulmonary:     Effort: Pulmonary effort is normal.     Breath sounds: Wheezing present.  Skin:    General: Skin is warm and dry.  Neurological:     General: No focal deficit present.     Mental Status: She is alert and oriented to person, place, and time.  Psychiatric:        Mood and Affect: Mood normal.        Behavior: Behavior normal.      UC Treatments / Results  Labs (all labs ordered are listed, but only abnormal results are displayed) Labs Reviewed - No data to display  EKG   Radiology No results found.  Procedures Procedures (including critical care time)  Medications Ordered in UC Medications - No data to display  Initial Impression / Assessment and Plan / UC Course  I have reviewed the triage vital signs and the nursing notes.  Pertinent labs & imaging results that were available during my care of the patient were reviewed by me and considered in my medical decision making (see  chart for details).   Patient is afebrile here without recent antipyretics. Satting well on room air. Overall is ill appearing, though well hydrated and without respiratory distress. Pulmonary exam is remarkable for the presence of wheezes in the upper lobes.   Suspect viral pathology for her symptoms.  She has no previous asthma or reactive airway diagnosis however significant wheezing is present here today.  Will treat with course of prednisone but asking her to delay taking it until tomorrow morning to avoid sleep disturbance.  Asked her to follow-up with her PCP following treatment to verify resolution of wheezing.  Final Clinical Impressions(s) / UC Diagnoses   Final diagnoses:  None   Discharge Instructions   None    ED Prescriptions   None    PDMP not reviewed this encounter.   Charma Igo, [place] 04/20/22 1544

## 2022-04-20 NOTE — Discharge Instructions (Addendum)
Continue using over-the-counter medication for your symptom control tonight and going forward.  Start the course of prednisone tomorrow morning with breakfast.  Take the medication with food to avoid upsetting your stomach.  Please follow-up with your primary care provider after treatment to verify resolution of wheezing in your lungs.

## 2022-04-27 ENCOUNTER — Ambulatory Visit (INDEPENDENT_AMBULATORY_CARE_PROVIDER_SITE_OTHER): Payer: BC Managed Care – PPO

## 2022-04-27 ENCOUNTER — Ambulatory Visit
Admission: RE | Admit: 2022-04-27 | Discharge: 2022-04-27 | Disposition: A | Discharge status: Home / Self Care | Payer: BC Managed Care – PPO | Source: Ambulatory Visit | Attending: Physician Assistant | Admitting: Physician Assistant

## 2022-04-27 VITALS — BP 126/67 | HR 74 | Temp 98.6°F | Resp 18 | Ht 66.0 in | Wt 183.0 lb

## 2022-04-27 DIAGNOSIS — R0602 Shortness of breath: Secondary | ICD-10-CM | POA: Diagnosis not present

## 2022-04-27 DIAGNOSIS — J209 Acute bronchitis, unspecified: Secondary | ICD-10-CM

## 2022-04-27 DIAGNOSIS — R062 Wheezing: Secondary | ICD-10-CM

## 2022-04-27 DIAGNOSIS — R051 Acute cough: Secondary | ICD-10-CM

## 2022-04-27 DIAGNOSIS — R059 Cough, unspecified: Secondary | ICD-10-CM

## 2022-04-27 DIAGNOSIS — R0989 Other specified symptoms and signs involving the circulatory and respiratory systems: Secondary | ICD-10-CM | POA: Diagnosis not present

## 2022-04-27 MED ORDER — ALBUTEROL SULFATE HFA 108 (90 BASE) MCG/ACT IN AERS: 1.0000 | INHALATION_SPRAY | Freq: Four times a day (QID) | RESPIRATORY_TRACT | 0 refills | Status: AC | PRN

## 2022-04-27 MED ORDER — PROMETHAZINE-DM 6.25-15 MG/5ML PO SYRP: 5.0000 mL | ORAL_SOLUTION | Freq: Four times a day (QID) | ORAL | 0 refills | Status: AC | PRN

## 2022-04-27 NOTE — ED Provider Notes (Signed)
[facility]    CSN: [id number] Arrival date & time: 04/27/22  1517      History   Chief Complaint Chief Complaint  Patient presents with   Cough    Appointment    HPI Linda Rivers is a 61 y.o. female presenting for approximately 2-week history of cough and congestion.  Also reporting wheezing and occasional shortness of breath.  She was seen 1 week ago and evaluated here.  Thought to have bronchitis and prescribed prednisone.  She finished the medication yesterday.  Reports that she is really feel much better and now her cough which was productive of creamy white sputum is now productive of yellowish-green sputum.  Denies any chest pain.  Has not had any fevers.  Reports her sinus congestion and pressure has cleared up.  She is concerned about infection in her chest.  She reports a history of bronchitis in the past.  No previous diagnosis of asthma or any respiratory problems.  Says that someone gave her Advair in the past for bronchitis when it lasted for really long time last year.  She has taken over-the-counter cough medicine and says it has not really helped.  She is concerned about worsening infection.  No other complaints.  HPI  History reviewed. No pertinent past medical history.  There are no problems to display for this patient.   Past Surgical History:  Procedure Laterality Date   BACK SURGERY      OB History   No obstetric history on file.      Home Medications    Prior to Admission medications   Medication Sig Start Date End Date Taking? Authorizing Provider  albuterol (VENTOLIN HFA) 108 (90 Base) MCG/ACT inhaler Inhale 1-2 puffs into the lungs every 6 (six) hours as needed for wheezing or shortness of breath. 04/27/22  Yes Shirlee Latch, PA-C  promethazine-dextromethorphan (PROMETHAZINE-DM) 6.25-15 MG/5ML syrup Take 5 mLs by mouth 4 (four) times daily as needed. 04/27/22  Yes Shirlee Latch, PA-C    Family History Family History  Problem  Relation Age of Onset   Breast cancer Paternal Aunt 69    Social History Social History   Tobacco Use   Smoking status: Never   Smokeless tobacco: Never  Vaping Use   Vaping Use: Never used  Substance Use Topics   Alcohol use: Yes   Drug use: Never     Allergies   Patient has no known allergies.   Review of Systems Review of Systems  Constitutional:  Positive for fatigue. Negative for chills, diaphoresis and fever.  HENT:  Positive for congestion and rhinorrhea. Negative for ear pain, sinus pressure, sinus pain and sore throat.   Respiratory:  Positive for cough, shortness of breath and wheezing.   Cardiovascular:  Negative for chest pain.  Gastrointestinal:  Negative for abdominal pain, nausea and vomiting.  Musculoskeletal:  Negative for arthralgias and myalgias.  Skin:  Negative for rash.  Neurological:  Negative for weakness and headaches.  Hematological:  Negative for adenopathy.  Psychiatric/Behavioral:  Positive for sleep disturbance.      Physical Exam Triage Vital Signs ED Triage Vitals  Enc Vitals Group     BP 04/27/22 1533 126/67     Pulse Rate 04/27/22 1533 74     Resp 04/27/22 1533 18     Temp 04/27/22 1533 98.6 F (37 C)     Temp Source 04/27/22 1533 Oral     SpO2 04/27/22 1533 92 %  Weight 04/27/22 1532 183 lb (83 kg)     Height 04/27/22 1532 5\' 6"  (1.676 m)     Head Circumference --      Peak Flow --      Pain Score 04/27/22 1532 0     Pain Loc --      Pain Edu? --      Excl. in GC? --    No data found.  Updated Vital Signs BP 126/67 (BP Location: Left Arm)   Pulse 74   Temp 98.6 F (37 C) (Oral)   Resp 18   Ht 5\' 6"  (1.676 m)   Wt 183 lb (83 kg)   SpO2 99%   BMI 29.54 kg/m     Physical Exam Vitals and nursing note reviewed.  Constitutional:      General: She is not in acute distress.    Appearance: Normal appearance. She is not ill-appearing or toxic-appearing.  HENT:     Head: Normocephalic and atraumatic.     Nose:  Congestion present.     Mouth/Throat:     Mouth: Mucous membranes are moist.     Pharynx: Oropharynx is clear.  Eyes:     General: No scleral icterus.       Right eye: No discharge.        Left eye: No discharge.     Conjunctiva/sclera: Conjunctivae normal.  Cardiovascular:     Rate and Rhythm: Normal rate and regular rhythm.     Heart sounds: Normal heart sounds.  Pulmonary:     Effort: Pulmonary effort is normal. No respiratory distress.     Breath sounds: Wheezing (few scattered wheezes throughout) present.  Musculoskeletal:     Cervical back: Neck supple.  Skin:    General: Skin is dry.  Neurological:     General: No focal deficit present.     Mental Status: She is alert. Mental status is at baseline.     Motor: No weakness.     Gait: Gait normal.  Psychiatric:        Mood and Affect: Mood normal.        Behavior: Behavior normal.        Thought Content: Thought content normal.      UC Treatments / Results  Labs (all labs ordered are listed, but only abnormal results are displayed) Labs Reviewed - No data to display  EKG   Radiology DG Chest 2 View  Result Date: 04/27/2022 CLINICAL DATA:  Cough congestion with shortness of breath for 1 week. EXAM: CHEST - 2 VIEW COMPARISON:  None Available. FINDINGS: The heart size and mediastinal contours are within normal limits. Both lungs are clear. The visualized skeletal structures are unremarkable. IMPRESSION: No active cardiopulmonary disease. Electronically Signed   By: M.D.   On: 04/27/2022 16:10    Procedures Procedures (including critical care time)  Medications Ordered in UC Medications - No data to display  Initial Impression / Assessment and Plan / UC Course  I have reviewed the triage vital signs and the nursing notes.  Pertinent labs & imaging results that were available during my care of the patient were reviewed by me and considered in my medical decision making (see chart for details).    61 year old female presents for cough, congestion, wheezing and shortness of breath x 2 weeks.  Seen 1 week ago and prescribed prednisone for 6 days.  Improvement in sinus congestion and sinus pressure has resolved.  Reports sputum is yellowish-greenish in color.  No fevers.  Taking OTC cough meds.  Vitals normal stable.  Patient overall well-appearing.  On exam she has nasal congestion which is minimal.  Scattered wheezes here and there throughout.  Chest x-ray performed which does not show any evidence of pneumonia.  Discussed result patient.  Suspect continued viral bronchitis and I advised her that symptoms can last for several weeks.  Offered DuoNeb or albuterol treatment but she declines.  She is interested in a inhaler prescriptions have sent albuterol to pharmacy.  Also sent Promethazine DM.  Will hold off on another round of prednisone.  Encourage plenty of rest and fluids.  Advised her to return if she is not feeling better in a couple weeks or she develops fever, worsening cough or increased shortness of breath.   Final Clinical Impressions(s) / UC Diagnoses   Final diagnoses:  Acute bronchitis, unspecified organism  Acute cough  Wheezing     Discharge Instructions      -Your x-ray is normal. You do not have pneumonia.  -You have viral bronchitis which can last for 4-6 weeks. -I have sent cough medicine and Proair. Increase rest and fluids. If you have fever, worsening cough, increased shortness of breath, or are not feeling better in 2 more weeks, please return for another evaluation.      ED Prescriptions     Medication Sig Dispense Auth. Provider   albuterol (VENTOLIN HFA) 108 (90 Base) MCG/ACT inhaler Inhale 1-2 puffs into the lungs every 6 (six) hours as needed for wheezing or shortness of breath. 1 g Shirlee Latch, PA-C   promethazine-dextromethorphan (PROMETHAZINE-DM) 6.25-15 MG/5ML syrup Take 5 mLs by mouth 4 (four) times daily as needed. 118 mL Shirlee Latch,  PA-C      PDMP not reviewed this encounter.   Shirlee Latch, PA-C 04/27/22 1654

## 2022-04-27 NOTE — ED Triage Notes (Signed)
Pt c/o continued cough x1week.  Pt states that she has finsihed the prednisone and still uses the robitussin at night.  Pt states that she is now coughing up green mucus and the congestion has went to her chest.

## 2022-04-27 NOTE — Discharge Instructions (Addendum)
-  Your x-ray is normal. You do not have pneumonia.  -You have viral bronchitis which can last for 4-6 weeks. -I have sent cough medicine and Proair. Increase rest and fluids. If you have fever, worsening cough, increased shortness of breath, or are not feeling better in 2 more weeks, please return for another evaluation.

## 2022-06-07 LAB — COLOGUARD

## 2022-06-29 LAB — COLOGUARD: COLOGUARD: NEGATIVE

## 2024-02-04 ENCOUNTER — Ambulatory Visit (LOCAL_COMMUNITY_HEALTH_CENTER): Payer: Self-pay

## 2024-02-04 DIAGNOSIS — Z111 Encounter for screening for respiratory tuberculosis: Secondary | ICD-10-CM

## 2024-02-06 ENCOUNTER — Ambulatory Visit: Payer: Self-pay

## 2024-02-06 DIAGNOSIS — Z111 Encounter for screening for respiratory tuberculosis: Secondary | ICD-10-CM

## 2024-02-06 LAB — TB SKIN TEST
Induration: 0 mm
TB Skin Test: NEGATIVE
